# Patient Record
Sex: Male | Born: 1975 | Race: White | Hispanic: No | Marital: Single
Health system: Midwestern US, Community
[De-identification: ages and names within clinical notes are randomized; demographics above are authoritative.]

## PROBLEM LIST (undated history)

## (undated) DIAGNOSIS — N200 Calculus of kidney: Secondary | ICD-10-CM

## (undated) DIAGNOSIS — K219 Gastro-esophageal reflux disease without esophagitis: Secondary | ICD-10-CM

## (undated) DIAGNOSIS — K649 Unspecified hemorrhoids: Secondary | ICD-10-CM

## (undated) DIAGNOSIS — K449 Diaphragmatic hernia without obstruction or gangrene: Secondary | ICD-10-CM

## (undated) DIAGNOSIS — M545 Low back pain, unspecified: Secondary | ICD-10-CM

## (undated) HISTORY — PX: TESTICLE TORSION REDUCTION: SHX795

---

## 2011-11-12 ENCOUNTER — Inpatient Hospital Stay
Admit: 2011-11-12 | Discharge: 2011-11-13 | Disposition: A | Discharge status: Home / Self Care | Attending: Emergency Medicine

## 2011-11-12 MED ADMIN — ibuprofen (ADVIL;MOTRIN) tablet 600 mg: ORAL | @ 23:00:00 | NDC 53746046505

## 2011-11-12 MED FILL — IBUPROFEN 600 MG PO TABS: 600 MG | ORAL | Qty: 1
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
## 2012-02-14 ENCOUNTER — Inpatient Hospital Stay
Admit: 2012-02-14 | Discharge: 2012-02-15 | Disposition: A | Discharge status: Home / Self Care | Attending: Emergency Medicine

## 2012-02-14 LAB — CBC WITH AUTO DIFFERENTIAL
Basophils %: 0.6 %
Basophils Absolute: 0.1 10*3/uL (ref 0.0–0.2)
Eosinophils %: 1.8 %
Eosinophils Absolute: 0.2 10*3/uL (ref 0.0–0.6)
Hematocrit: 49.3 % (ref 40.5–52.5)
Hemoglobin: 16 g/dL (ref 13.5–17.5)
Lymphocytes %: 34.9 %
Lymphocytes Absolute: 3.5 10*3/uL (ref 1.0–5.1)
MCH: 30 pg (ref 26.0–34.0)
MCHC: 32.4 g/dL (ref 31.0–36.0)
MCV: 92.6 fL (ref 80.0–100.0)
MPV: 6.8 fL (ref 5.0–10.5)
Monocytes %: 8.3 %
Monocytes Absolute: 0.8 10*3/uL (ref 0.0–1.3)
Neutrophils %: 54.4 %
Neutrophils Absolute: 5.4 10*3/uL (ref 1.7–7.7)
Platelets: 287 10*3/uL (ref 135–450)
RBC: 5.32 M/uL (ref 4.20–5.90)
RDW: 12.8 % (ref 12.4–15.4)
WBC: 9.9 10*3/uL (ref 4.0–11.0)

## 2012-02-14 NOTE — Progress Notes (Signed)
This encounter was created in error - please disregard.

## 2012-02-14 NOTE — ED Provider Notes (Signed)
[place] COMPLAINT  Chest Pain and Back Pain      HISTORY OF PRESENT ILLNESS  Jesse Ortiz is a 36 y.o. male with PMH of asthma, allergies, GERD who presents  to the ED by private vehicle with complaints of chest tightness and low back pain.    Back pain-he reports that he is intermittently had lower back pain off and on for many years, and the last 4 days however his lower back pain has been worse, it is sharp in nature and feels like a needle stabbing him in the back.  It is in the middle lower back.  It sometimes radiates slightly higher in his back but does not radiate into his legs.  He occasionally has tingling in his legs but not currently his sensation is normal.  He has no saddle anesthesia, bowel or bladder dysfunction.  He reports that certain movements will exacerbate the pain, sitting up in a chair too tall or when he is lying on his side in bed and pulled his knees to his chest.  He also notes that if he looks down with his head and put his chin to his chest, this also can exacerbate his discomfort.  Ambulating normally.  He is active with his job    Chest pain-this is also been occurring more over the last 4 days although he reports that he has more chronically had intermittent sharp pains in the anterior section of his chest.  In the last 4 days he's noticed more of a pressure.  It is worse with deep breathing or palpation.  It will last for a few minutes and then resolve.  He reports an associated dizziness, particularly with standing up.  He does not feel his asthma has been acting up, he has not had wheezing, fevers or URI symptoms.  No diaphoresis.  No dyspnea.      Jesse Ortiz's positive PE risk factors include: none.  Jesse Ortiz does not have the following risk factors: calf pain, history of deep venous thrombosis, history of pulmonary embolus, history of malignancy, long duration of automobile or plane travel, prolonged stay in bed, recent  limb injury or fracture, recent surgery and varicose veins.    Jesse Ortiz's positive cardiac risk factors include:  smoker, asthma.  Jesse Ortiz does not have the following risk factors: known cardiac disease, prior MI, Diabetes Mellitus, hypertension, hypercholesterolemia/hyperlipidemia, CHF, arrhythmia, generalized debilitation, COPD, renal disease.    No other complaints, modifying factors or associated symptoms.     Nursing notes reviewed.   Past Medical History   Diagnosis Date   ??? GERD (gastroesophageal reflux disease)    ??? Seasonal allergies      Past Surgical History   Procedure Laterality Date   ??? Testicle surgery       History reviewed. No pertinent family history.  History     Social History   ??? Marital Status: Single     Spouse Name: N/A     Number of Children: N/A   ??? Years of Education: N/A     Occupational History   ??? Not on file.     Social History Main Topics   ??? Smoking status: Current Every Day Smoker -- 1.00 packs/day   ??? Smokeless tobacco: Not on file   ??? Alcohol Use: Yes      monthly   ??? Drug Use: Yes     Special: Marijuana  last used 02/13/12 am   ??? Sexually Active: Not on file     Other Topics Concern   ??? Not on file     Social History Narrative   ??? No narrative on file     Prior to Admission medications    Medication Sig Start Date End Date Taking? Authorizing Provider   famotidine (PEPCID) 10 MG tablet Take 10 mg by mouth 2 times daily.    Historical Provider, MD     Allergies   Allergen Reactions   ??? Bee        REVIEW OF SYSTEMS    Constitutional:  Denies fever, chills, sweats, + generalized weakness  HENT:  Denies nasal congestion or sore throat   Eyes:  Denies change in vision  Respiratory:  Denies cough or shortness of breath   Cardiovascular:  +chest pain, denies edema   GI:  Denies abdominal pain, nausea, vomiting, diarrhea   GU:  Denies dysuria, difficulty urinating, bowel or bladder dysfunction  Neurologic:  Denies headache, focal weakness or sensory changes,  paresthesias/paralysis  Musculoskeletal:  + low back pain   Psychiatric:  Denies depression or anxiety       PHYSICAL EXAM  BP 133/78   Pulse 62   Temp(Src) 97.9 ??F (36.6 ??C) (Oral)   Resp 16   Ht 5' 9"$  (1.753 m)   Wt 180 lb (81.647 kg)   BMI 26.57 kg/m2   SpO2 98%  GENERAL APPEARANCE: Awake and alert. Cooperative. Well appearing young male, in no acute distress  HEAD: Normocephalic. Atraumatic.  EYES: EOM's grossly intact. Conjunctiva are WNLs  ENT: Mucous membranes are moist.   NECK: Normal ROM.   CV: Equal symmetric chest rise.  RRR, no m/r/g.  2+ distal pulses in all 4 extremities  CHEST: anterior chest is ttp which reproduces his complaint  LUNGS: Breathing is unlabored. Speaking comfortably in full sentences. CTA B.  No wheezing, rhonchi, rales  ABDOMEN: Normoactive bowel sounds.  Soft, nontender, nondistended  GU: deferred   EXTREMITIES: . Moving all 4 extremities spontaneously.  No acute deformities. All extremities neurovascularly intact.  BACK: he has no thoracic midline or paraspinal tenderness to palpation.  He has mild midline lumbosacral tenderness to palpation that reproduces his complaint, no paraspinal tenderness bilaterally lumbosacral.  His normal range of motion of his back in flexion and extension.  SKIN: Warm and dry.    NEUROLOGICAL: Alert and oriented.  Normal coordination. 5/5 strength in bilateral lower chin recent hip flexion, ankle plantar flexion, sensation to light touch is intact, patellar reflexes are 2+ and ankle jerk reflexes are 1+ bilaterally.    PSYCH: Mood and affect normal.      LABS  Results for orders placed during the hospital encounter of 02/14/12   CBC WITH AUTO DIFFERENTIAL       Result Value Range    WBC 9.9  4.0 - 11.0 K/uL    RBC 5.32  4.20 - 5.90 M/uL    Hemoglobin 16.0  13.5 - 17.5 g/dL    Hematocrit 49.3  40.5 - 52.5 %    MCV 92.6  80.0 - 100.0 fL    MCH 30.0  26.0 - 34.0 pg    MCHC 32.4  31.0 - 36.0 g/dL    RDW 12.8  12.4 - 15.4 %    Platelets 287  135 - 450 K/uL     MPV 6.8  5.0 - 10.5 fL    Neutrophils Relative 54.4  Lymphocytes Relative 34.9      Monocytes Relative 8.3      Eosinophils 1.8      Basophils Relative 0.6      Neutrophils Absolute 5.4  1.7 - 7.7 K/uL    Lymphocytes Absolute 3.5  1.0 - 5.1 K/uL    Monocytes Absolute 0.8  0.0 - 1.3 K/uL    Eosinophils Absolute 0.2  0.0 - 0.6 K/uL    Basophils Absolute 0.1  0.0 - 0.2 K/uL   TROPONIN       Result Value Range    Troponin I <0.006  0.000 - 0.040 ng/mL   PROTIME-INR       Result Value Range    Protime 10.8  10.0 - 12.8 sec    INR 0.96  0.85 - 1.15   COMPREHENSIVE METABOLIC PANEL       Result Value Range    Sodium 144  136 - 145 mEq/L    Potassium 4.1  3.5 - 5.1 mEq/L    Chloride 105  99 - 110 mEq/L    CO2 28  21 - 32 mEq/L    Glucose 107 (*) 70 - 99 mg/dL    BUN 24 (*) 7 - 18 mg/dL    Creatinine, Ser 1.4 (*) 0.9 - 1.3 mg/dL    GFR Non-African American >60  >60    GFR African American >60  >60    Calcium 10.4  8.3 - 10.6 mg/dL    Total Protein 8.5 (*) 6.4 - 8.2 g/dL    Alb 5.2 (*) 3.4 - 5.0 g/dL    Albumin/Globulin Ratio 1.6  1.1 - 2.2    Total Bilirubin 0.46  0.00 - 1.00 mg/dL    Alkaline Phosphatase 65  45 - 129 U/L    ALT 16  10 - 40 U/L    AST 17  15 - 37 U/L    Globulin 3     TROPONIN       Result Value Range    Troponin I <0.006  0.000 - 0.040 ng/mL        RADIOLOGY  X-RAYS: I have reviewed the films and provided a preliminary interpretation unless formal radiology read is provided    All non-plain film images such as CT, MRI, Ultrasound, have been read and interpreted by the Radiologist.    XR CHEST STANDARD TWO VW    Final Result: IMPRESSION:          No acute cardiopulmonary abnormality    chest x-ray by my preliminary review reveals no infiltrate, pneumothorax and mediastinum appears normal        EKG  The Ekg interpreted by me in the absence of a cardiologist shows.   normal sinus rhythm at a rate of 97.  Intervals and axis are normal.  There nonspecific changes without evidence of acute ischemia.  There  is a sinus arrhythmia.  There are no old EKGs for comparison.      Repeat EKG performed at 9:39 PM  Normal sinus rhythm at a rate of 62 with sinus arrhythmia.  Intervals and axis are normal.  There nonspecific changes without evidence of acute ischemia.  There is no significant change from EKG earlier today         ED COURSE / MDM  Patient seen and evaluated. Labs and imaging reviewed and results discussed with patient    ALYN LOKEN is a 36 y.o. male with 2 complaints, chest pain and back pain.  With respect  to his lower back pain, this appears to be musculoskeletal in nature.  36 y.o. male with acute back pain.  his neurological exam is normal and he is afebrile. The hx and PE are c/w mechanical low back pain.  There is no evidence of  a vascular or infectious etiology, cauda equina, epidural abscess, nor an acute fracture.  Given hospital reassuring exam, no further imaging/MRI is indicated at this time.      He is also complaining of intermittent chest tightness over the last several days with intermittent chest discomfort off-and-on on a more chronic basis.  Initial EKG and troponin are negative.  He is an otherwise relatively healthy aside from his smoking history.  Plan will be to treat symptomatically and checked a second troponin and EKG.  He meets PERC 8 Criteria for DVT, is low risk and does not require additional investigation    On final reevaluation at 10:45 PM, repeat EKG and troponin remained negative.  The patient did receive some relief with medications here although his had intermittent and changing pain throughout his stay.  We have discussed taking his Pepcid on a more regular basis to help with his reflux.  We also reviewed the imaging studies and laboratory data obtained here.  I've advised that his back pain is likely musculoskeletal in nature and demonstrates no high-risk symptoms, plan will be for symptomatic care, work note and counseled on the appropriate form when lifting as he is  required to lift heavy weights at work.    The plan at this time will be to establish primary care, and referred him to our on-call primary care physician as well as our patient advocate for further evaluation and management of his symptoms.    Given Jesse Ortiz overall clinical presentation and the w/u detailed above, I feel that dc home with outpt f/u is appropriate at this time. I have discussed all of my concerns as well as s/s for which to return with the patient. I have reviewed test results and answered any questions to the best of my ability. I have discussed s/s for which to return to the ED, and Jesse Ortiz expresses understanding of results and plan, agrees with the disposition, and assures me will that he will return to the ED with ANY progression in symptoms.      ED Treatments Administered-      Medications   famotidine (PEPCID) injection 20 mg (20 mg Intravenous Given 02/14/12 1940)   ketorolac (TORADOL) injection 30 mg (30 mg Intravenous Given 02/14/12 1940)   0.9 % sodium chloride bolus (1,000 mLs Intravenous Given 02/14/12 2011)   GI COCKTAIL 1 Bottle (1 Bottle Oral Given 02/14/12 2035)   morphine (PF) injection 4 mg (4 mg Intravenous Given 02/14/12 2035)        Results for orders placed during the hospital encounter of 02/14/12   CBC WITH AUTO DIFFERENTIAL       Result Value Range    WBC 9.9  4.0 - 11.0 K/uL    RBC 5.32  4.20 - 5.90 M/uL    Hemoglobin 16.0  13.5 - 17.5 g/dL    Hematocrit 49.3  40.5 - 52.5 %    MCV 92.6  80.0 - 100.0 fL    MCH 30.0  26.0 - 34.0 pg    MCHC 32.4  31.0 - 36.0 g/dL    RDW 12.8  12.4 - 15.4 %    Platelets 287  135 - 450 K/uL  MPV 6.8  5.0 - 10.5 fL    Neutrophils Relative 54.4      Lymphocytes Relative 34.9      Monocytes Relative 8.3      Eosinophils 1.8      Basophils Relative 0.6      Neutrophils Absolute 5.4  1.7 - 7.7 K/uL    Lymphocytes Absolute 3.5  1.0 - 5.1 K/uL    Monocytes Absolute 0.8  0.0 - 1.3 K/uL    Eosinophils Absolute 0.2  0.0 - 0.6 K/uL     Basophils Absolute 0.1  0.0 - 0.2 K/uL   TROPONIN       Result Value Range    Troponin I <0.006  0.000 - 0.040 ng/mL   PROTIME-INR       Result Value Range    Protime 10.8  10.0 - 12.8 sec    INR 0.96  0.85 - 1.15   COMPREHENSIVE METABOLIC PANEL       Result Value Range    Sodium 144  136 - 145 mEq/L    Potassium 4.1  3.5 - 5.1 mEq/L    Chloride 105  99 - 110 mEq/L    CO2 28  21 - 32 mEq/L    Glucose 107 (*) 70 - 99 mg/dL    BUN 24 (*) 7 - 18 mg/dL    Creatinine, Ser 1.4 (*) 0.9 - 1.3 mg/dL    GFR Non-African American >60  >60    GFR African American >60  >60    Calcium 10.4  8.3 - 10.6 mg/dL    Total Protein 8.5 (*) 6.4 - 8.2 g/dL    Alb 5.2 (*) 3.4 - 5.0 g/dL    Albumin/Globulin Ratio 1.6  1.1 - 2.2    Total Bilirubin 0.46  0.00 - 1.00 mg/dL    Alkaline Phosphatase 65  45 - 129 U/L    ALT 16  10 - 40 U/L    AST 17  15 - 37 U/L    Globulin 3     TROPONIN       Result Value Range    Troponin I <0.006  0.000 - 0.040 ng/mL       I estimate there is LOW risk for PULMONARY EMBOLISM, ACUTE CORONARY SYNDROME, OR THORACIC AORTIC DISSECTION, ABDOMINAL AORTIC ANEURYSM, CAUDA EQUINA SYNDROME, EPIDURAL MASS LESION, SPINAL STENOSIS, OR HERNIATED DISK CAUSING SEVERE STENOSIS, thus I consider the discharge disposition reasonable. Jesse Ortiz and I have discussed the diagnosis and risks, and we agree with discharging home to follow-up with their primary doctor. We also discussed returning to the Emergency Department immediately if new or worsening symptoms occur. We have discussed the symptoms which are most concerning (e.g., bloody sputum, fever, worsening pain or shortness of breath, vomiting) that necessitate immediate return.     Clinical Impression    1. Chest pain    2. Low back pain        Blood pressure 133/78, pulse 62, temperature 97.9 ??F (36.6 ??C), temperature source Oral, resp. rate 16, height 5' 9"$  (1.753 m), weight 180 lb (81.647 kg), SpO2 98.00%.     DISPOSITION - Patient was discharged in stable  condition    Jesse Ortiz was given scripts for the following medications. I counseled patient how to take these medications.   New Prescriptions    CYCLOBENZAPRINE (FLEXERIL) 10 MG TABLET    Take 1 tablet by mouth 3 times daily as needed for Muscle spasms (pain) for 15 doses.  No driving while taking this medication    HYDROCODONE-ACETAMINOPHEN (NORCO) 5-325 MG PER TABLET    Take 1 tablet by mouth every 6 hours as needed for Pain for 15 doses. This medication contains acetaminophen (tylenol) - do not take more than 30109m of acetaminophen per day from all sources. No driving or other high risk behavior while taking this medication       Disclaimer: This chart was created using the [employer].       KRonna Polio MD  02/14/12 [phone]

## 2012-02-14 NOTE — Discharge Instructions (Signed)
If you have trouble scheduling an appointment with the physician that we have referred you to today, please contact our Patient Resource Advocate, Purcell Nails. If she is unavailable, please leave a message and she will return your call.     Patient Resource Advocate  Purcell Nails  [phone]        Return for increased or worsening chest pain, trouble breathing, fever, productive cough, dizziness, if you pass out or feel like you might pass out or if you are worse in any way.     Return immediately for worsening pain, numbness or tingling in your arms or legs, if you  are unable to walk or develop weakness in one arm or leg, if you cannot hold your urine  or stool, or if you develop nausea and vomiting that will not resolve.                      Back Exercises  Back exercises help treat and prevent back injuries. The goal is to increase your strength in your belly (abdominal) and back muscles. These exercises can also help with flexibility. Start these exercises when told by your doctor.  HOME CARE  Back exercises include:  Pelvic Tilt.   Lie on your back with your knees bent. Tilt your pelvis until the lower part of your back is against the floor. Hold this position 5 to 10 sec. Repeat this exercise 5 to 10 times.  Knee to Chest.   Pull 1 knee up against your chest and hold for 20 to 30 seconds. Repeat this with the other knee. This may be done with the other leg straight or bent, whichever feels better. Then, pull both knees up against your chest.  Sit-Ups or Curl-Ups.   Bend your knees 90 degrees. Start with tilting your pelvis, and do a partial, slow sit-up. Only lift your upper half 30 to 45 degrees off the floor. Take at least 2 to 3 seonds for each sit-up. Do not do sit-ups with your knees out straight. If partial sit-ups are difficult, simply do the above but with only tightening your belly (abdominal) muscles and holding it as told.  Hip-Lift.   Lie on your back with your knees flexed 90 degrees.  Push down with your feet and shoulders as you raise your hips 2 inches off the floor. Hold for 10 seconds, repeat 5 to 10 times.  Back Arches.   Lie on your stomach. Prop yourself up on bent elbows. Slowly press on your hands, causing an arch in your low back. Repeat 3 to 5 times.  Shoulder-Lifts.   Lie face down with arms beside your body. Keep hips and belly pressed to floor as you slowly lift your head and shoulders off the floor.  Do not overdo your exercises. Be careful in the beginning. Exercises may cause you some mild back discomfort. If the pain lasts for more than 15 minutes, stop the exercises until you see your doctor. Improvement with exercise for back problems is slow.    Document Released: 05/01/2010 Document Revised: 06/21/2011 Document Reviewed: 01/28/2011  [facility] Patient Information 2013 [place].        Back Pain, Adult  Back pain is very common. The pain often gets better over time. The cause of back pain is usually not dangerous. Most people can learn to manage their back pain on their own.    HOME CARE     Stay active. Start with short walks  on flat ground if you can. Try to walk farther each day.   Do not sit, drive, or stand in one place for more than 30 minutes. Do not stay in bed.   Do not avoid exercise or work. Activity can help your back heal faster.   Be careful when you bend or lift an object. Bend at your knees, keep the object close to you, and do not twist.   Sleep on a firm mattress. Lie on your side, and bend your knees. If you lie on your back, put a pillow under your knees.   Only take medicines as told by your doctor.   Put ice on the injured area.   Put ice in a plastic bag.   Place a towel between your skin and the bag.   Leave the ice on for 15 to 20 minutes, 3 to 4 times a day for the first 2 to 3 days. After that, you can switch between ice and heat packs.   Ask your doctor about back exercises or massage.   Avoid feeling anxious or stressed. Find  good ways to deal with stress, such as exercise.  GET HELP RIGHT AWAY IF:     Your pain does not go away with rest or medicine.   Your pain does not go away in 1 week.   You have new problems.   You do not feel well.   The pain spreads into your legs.   You cannot control when you poop (bowel movement) or pee (urinate).   Your arms or legs feel weak or lose feeling (numbness).   You feel sick to your stomach (nauseous) or throw up (vomit).   You have belly (abdominal) pain.   You feel like you may pass out (faint).  MAKE SURE YOU:     Understand these instructions.   Will watch your condition.   Will get help right away if you are not doing well or get worse.  Document Released: 09/15/2007 Document Revised: 06/21/2011 Document Reviewed: 08/17/2010  [facility] Patient Information 2013 [place].        Chest Pain, Nonspecific  It is often hard to give a specific diagnosis for the cause of chest pain. There is always a chance that your pain could be related to something serious, like a heart attack or a blood clot in the lungs. You need to follow up with your caregiver for further evaluation. More lab tests or other studies such as X-rays, electrocardiography, stress testing, or cardiac imaging may be needed to find the cause of your pain.  Most of the time, nonspecific chest pain improves within 2 to 3 days with rest and mild pain medicine. For the next few days, avoid physical exertion or activities that bring on pain. Do not smoke. Avoid drinking alcohol. Call your caregiver for routine follow-up as advised.    SEEK IMMEDIATE MEDICAL CARE IF:   You develop increased chest pain or pain that radiates to the arm, neck, jaw, back, or abdomen.   You develop shortness of breath, increased coughing, or you start coughing up blood.   You have severe back or abdominal pain, nausea, or vomiting.   You develop severe weakness, fainting, fever, or chills.  Document Released: 03/29/2005 Document Revised:  06/21/2011 Document Reviewed: 09/16/2006  [facility] Patient Information 2013 [place].

## 2012-02-14 NOTE — ED Notes (Signed)
Discharge instructions given pt verbalize understanding. Pt ambulated to door to ride without difficulty     Christa See, RN  02/14/12 2311

## 2012-02-15 LAB — COMPREHENSIVE METABOLIC PANEL
ALT: 16 U/L (ref 10–40)
AST: 17 U/L (ref 15–37)
Albumin/Globulin Ratio: 1.6 (ref 1.1–2.2)
Albumin: 5.2 g/dL — ABNORMAL HIGH (ref 3.4–5.0)
Alkaline Phosphatase: 65 U/L (ref 45–129)
BUN: 24 mg/dL — ABNORMAL HIGH (ref 7–18)
CO2: 28 mEq/L (ref 21–32)
Calcium: 10.4 mg/dL (ref 8.3–10.6)
Chloride: 105 mEq/L (ref 99–110)
Creatinine: 1.4 mg/dL — ABNORMAL HIGH (ref 0.9–1.3)
GFR African American: >60 (ref >60)
GFR Non-African American: >60 (ref >60)
Globulin: 3 g/dL
Glucose: 107 mg/dL — ABNORMAL HIGH (ref 70–99)
Potassium: 4.1 mEq/L (ref 3.5–5.1)
Sodium: 144 mEq/L (ref 136–145)
Total Bilirubin: 0.46 mg/dL (ref 0.00–1.00)
Total Protein: 8.5 g/dL — ABNORMAL HIGH (ref 6.4–8.2)

## 2012-02-15 LAB — PROTIME-INR
INR: 0.96 (ref 0.85–1.15)
Protime: 10.8 s (ref 10.0–12.8)

## 2012-02-15 LAB — TROPONIN
Troponin I: <0.006 ng/mL (ref 0.000–0.040)
Troponin I: <0.006 ng/mL (ref 0.000–0.040)

## 2012-02-15 MED ORDER — CYCLOBENZAPRINE HCL 10 MG PO TABS
10 MG | ORAL_TABLET | Freq: Three times a day (TID) | ORAL | Status: AC | PRN
Start: 2012-02-15 — End: 2012-02-24

## 2012-02-15 MED ORDER — HYDROCODONE-ACETAMINOPHEN 5-325 MG PO TABS
5-325 MG | ORAL_TABLET | Freq: Four times a day (QID) | ORAL | Status: AC | PRN
Start: 2012-02-15 — End: 2012-02-21

## 2012-02-15 MED ADMIN — morphine (PF) injection 4 mg: INTRAVENOUS | @ 02:00:00 | NDC 00409189101

## 2012-02-15 MED ADMIN — ketorolac (TORADOL) injection 30 mg: INTRAVENOUS | @ 01:00:00 | NDC 00409379501

## 2012-02-15 MED ADMIN — famotidine (PEPCID) injection 20 mg: INTRAVENOUS | @ 01:00:00 | NDC 00069012101

## 2012-02-15 MED ADMIN — GI COCKTAIL 1 Bottle: ORAL | @ 02:00:00 | NDC 09999990073

## 2012-02-15 MED ADMIN — 0.9 % sodium chloride bolus: INTRAVENOUS | @ 01:00:00 | NDC 00338004904

## 2012-02-15 MED FILL — FAMOTIDINE 10 MG/ML IV SOLN: 10 MG/ML | INTRAVENOUS | Qty: 2

## 2012-02-15 MED FILL — MORPHINE SULFATE (PF) 4 MG/ML IV SOLN: 4 MG/ML | INTRAVENOUS | Qty: 1

## 2012-02-15 MED FILL — KETOROLAC TROMETHAMINE 30 MG/ML IJ SOLN: 30 MG/ML | INTRAMUSCULAR | Qty: 1

## 2012-02-15 MED FILL — GI COCKTAIL: Qty: 1

## 2012-02-15 MED FILL — SODIUM CHLORIDE 0.9 % IV SOLN: 0.9 % | INTRAVENOUS | Qty: 1000

## 2012-02-16 LAB — EKG 12-LEAD
Atrial Rate: 62 {beats}/min
Atrial Rate: 97 {beats}/min
P Axis: 37 degrees
P Axis: 66 degrees
P-R Interval: 120 ms
P-R Interval: 126 ms
Q-T Interval: 336 ms
Q-T Interval: 396 ms
QRS Duration: 92 ms
QRS Duration: 98 ms
QTc Calculation (Bazett): 401 ms
QTc Calculation (Bazett): 426 ms
R Axis: 51 degrees
R Axis: 76 degrees
T Axis: 38 degrees
T Axis: 53 degrees
Ventricular Rate: 62 {beats}/min
Ventricular Rate: 97 {beats}/min

## 2012-11-16 ENCOUNTER — Inpatient Hospital Stay: Admit: 2012-11-16 | Discharge: 2012-11-16 | Attending: Emergency Medicine

## 2012-11-16 MED ORDER — CYCLOBENZAPRINE HCL 10 MG PO TABS
10 MG | ORAL_TABLET | Freq: Three times a day (TID) | ORAL | Status: AC | PRN
Start: 2012-11-16 — End: 2012-11-26

## 2012-11-16 MED ORDER — NAPROXEN 500 MG PO TABS
500 MG | ORAL_TABLET | Freq: Two times a day (BID) | ORAL | Status: DC
Start: 2012-11-16 — End: 2013-11-23

## 2012-11-16 MED ADMIN — orphenadrine (NORFLEX) injection 60 mg: INTRAMUSCULAR | @ 11:00:00 | NDC 17478053802

## 2012-11-16 MED ADMIN — ketorolac (TORADOL) injection 60 mg: INTRAMUSCULAR | @ 11:00:00 | NDC 00409379601

## 2012-11-16 MED FILL — KETOROLAC TROMETHAMINE 60 MG/2ML IJ SOLN: 60 MG/2ML | INTRAMUSCULAR | Qty: 2

## 2012-11-16 MED FILL — ORPHENADRINE CITRATE 30 MG/ML IJ SOLN: 30 MG/ML | INTRAMUSCULAR | Qty: 2

## 2012-11-16 NOTE — Discharge Instructions (Signed)

## 2012-11-16 NOTE — ED Notes (Signed)
Medicated per MAR. Friend at bedside.     Evalina Field, RN  11/16/12 [phone]

## 2012-11-16 NOTE — ED Notes (Signed)
Reports to ER today for complaints of lower back pain after picking up a large rock. States he heard a "pop".  VSS. Call light in reach. Will monitor.     Vilinda Blanks, RN  11/16/12 [phone]

## 2012-11-16 NOTE — ED Provider Notes (Signed)
CHIEF COMPLAINT  Back Pain      HISTORY OF PRESENT ILLNESS  Jesse Ortiz  is a 37 y.o. male who presents to the ED at via private vehicle complaining of back pain.  Patient reports that he works in [employer].  Yesterday early in the morning he began noting back pain while lifting a  heavy rock.  Pain is located along the left lower back.  Patient reports mild pain while at rest, however, with certain movements such as twisting, pain is rated as 10/10.  Patient denies any blunt trauma to the back.  He further denies any radicular type symptoms, urinary incontinence, or saddle anesthesia.  Patient reports that he took "a couple of Advil "yesterday with moderate relief.  No other alleviating or exacerbating factors.    There are no other complaints, modifying factors or associated symptoms.     Nursing notes reviewed.       Past medical history:  has a past medical history of GERD (gastroesophageal reflux disease) and Seasonal allergies.    Past surgical history:  has past surgical history that includes Testicle surgery.    Home medications:   Prior to Admission medications    Medication Sig Start Date End Date Taking? Authorizing Provider   cyclobenzaprine (FLEXERIL) 10 MG tablet Take 1 tablet by mouth 3 times daily as needed for Muscle spasms for up to 10 days. 11/16/12 11/26/12 Yes Kaylyn Lim, DO   naproxen (NAPROSYN) 500 MG tablet Take 1 tablet by mouth 2 times daily. 11/16/12  Yes Kaylyn Lim, DO   famotidine (PEPCID) 10 MG tablet Take 10 mg by mouth 2 times daily.    Historical Provider, MD       Allergies   Allergen Reactions   ??? Nutritional Supplements        Social history:  reports that he has been smoking.  He does not have any smokeless tobacco history on file. He reports that  drinks alcohol. He reports that he uses illicit drugs (Marijuana).    Family history:  No family history on file.    REVIEW OF SYSTEMS  6 systems reviewed, pertinent positives per HPI otherwise noted to be  negative    PHYSICAL EXAM  Filed Vitals:    11/16/12 0619   BP: 151/76   Pulse: 86   Temp: 98.3 ??F (36.8 ??C)   Resp: 16       GENERAL: Patient is well-developed, well-nourished,  no acute distress.  Moderate  discomfort.  Non toxic appearing.   HEENT:  Normocephalic, atraumatic. PERRL.  Conjunctiva appear normal.  External ears are normal.  MMM  NECK: Supple with normal ROM.  Trachea midline  LUNGS:  Normal work of breathing.  Speaking comfortably in full sentences.  EXTREMITIES: 2+ distal pulses w/o edema.    MUSCULOSKELETAL:  no midline pain of cervical, thoracic, or lumbar spine.  Left paraspinal lumbar tenderness to palpation.  No tenderness of the sacroiliac joint.   Atraumatic extremities with normal ROM grossly.  No obvious bony deformities.  SKIN: Warm/dry.  No rashes/lesions noted.   PSYCHIATRIC: Patient is alert and oriented with normal affect  NEUROLOGIC: Cranial nerves grossly intact. Moves all extremities with equal strength. No gross sensory deficits.  No urinary incontinence or saddle anesthesia.  Negative straight leg test bilaterally.  Patellar reflexes are brisk.    Answers questions/follows commands appropriately.      ED COURSE/MDM  Nursing notes reviewed.  Pt was given the following medications  or treatments in the ED:       Patient was provided Toradol and Norflex injections in the emergency department.        Clinical Impression  Based on the presenting complaint, history, and physical exam, multiple diagnoses were considered.  Exam and workup here most c/w:  1. Lumbar strain, initial encounter        I discussed with Jesse Ortiz the results of evaluation in the ED, diagnosis, care, and prognosis.  The plan is to discharge to home.  Patient is in agreement with plan and questions have been answered.     I also discussed with Jesse Ortiz the reasons which may require a return visit and the importance of follow-up care.  Patient was provided follow-up information Dr. Joelene Millin from orthopedics   The patient is well-appearing, nontoxic, and improved at the time of discharge.  Patient agrees to call to arrange follow-up care as directed.   Jesse Ortiz understands to return immediately for worsening/change in symptoms.      Patient will be started on the following medications from the ED:  New Prescriptions    CYCLOBENZAPRINE (FLEXERIL) 10 MG TABLET    Take 1 tablet by mouth 3 times daily as needed for Muscle spasms for up to 10 days.    NAPROXEN (NAPROSYN) 500 MG TABLET    Take 1 tablet by mouth 2 times daily.         Disposition  Pt is discharged in stable condition.    Disposition Vitals:  BP 151/76   Pulse 86   Temp(Src) 98.3 ??F (36.8 ??C)   Resp 16   Ht 5' 9"$  (1.753 m)   Wt 175 lb (79.379 kg)   BMI 25.83 kg/m2   SpO2 97%                    Kaylyn Lim, DO  11/16/12 [phone]

## 2013-10-19 MED ORDER — METHYLPREDNISOLONE (PAK) 4 MG PO TABS
4 MG | ORAL_TABLET | ORAL | Status: DC
Start: 2013-10-19 — End: 2013-11-23

## 2013-10-19 NOTE — Progress Notes (Signed)
CHIEF COMPLAINT:  low back pain    HISTORY OF PRESENT ILLNESS: Jesse Ortiz is a 38 year old male who presents today for evaluation of chronic low back pain.  He reports that he originally had some discomfort approximately 1 year ago.  He went to the emergency department for management.  He was given a muscle relaxant which did not give him any significant relief.  The pain he presents for evaluation with today is the same as his previous pain.  He is located in the left back pocket with radiation down the left leg.  He has positive numbness and tingling of the left lower extremity.  He denies any injury or fall.  His lower back is been bothering him since Monday of this week.  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  He denies any previous surgical intervention of the lumbar spine.  He denies any loss of bladder or bowel function. He admits to regular marijuana use which does help alleviate his pain.     REVIEW OF SYSTEMS:  Relevant review of systems reviewed and available in the patient's chart:     PHYSICAL EXAMINATION: Inspection of the lumbar spine shows no edema, deformity, abrasion.  No tenderness to palpation throughout the spinal column.  Some tenderness to palpation over left-sided SI.  There is tenderness to palpation on the left side paraspinal region.  There is a positive left lower extremity straight leg raise.  Positive cross leg test.  Positive bowstring sign.  Positive popliteal squeeze.  Reflexes at knees and ankles are full and equivocal.  Lower extremity strength is full and equivocal.  There is a minimally positive straight leg raise to the right lower extremity as well with a negative bowstring sign.    Inspection of the bilateral hips shows full and appropriate passive range of motion without any discomfort.  Negative logroll.    Cervical spine: The skin is warm and dry. There is no swelling, warmth, or erythema. Range of motion is within normal limits. There is no  paraspinal or spinous process tenderness. Spurling's sign is negative and did not produce shoulder pain. The distal neurovascular exam is grossly intact.    X-RAYS:  5 views of the lumbar spine were ordered and reviewed in the office today.  These demonstrate some degenerative changes with narrowing at L5-S1.  No evidence of acute fracture or dislocation.    ASSESSMENT:   1.  Chronic low back pain  2.  Lumbar degenerative disc disease  3.  HNP, left L5-S1    PLAN: I had a detailed discussion in the office with the patient.  I have recommended treatment to include a Medrol Dosepak.  I've also recommended an MRI in order to further evaluate any lumbar disc protrusion particularly at L5-S1 on the left side.  Following the MRI he will have a follow-up evaluation with our nonoperative spine physician for further treatment options.  He voiced understanding and agreed.

## 2013-10-23 ENCOUNTER — Encounter

## 2013-11-23 MED ORDER — MELOXICAM 15 MG PO TABS
15 MG | ORAL_TABLET | ORAL | Status: AC
Start: 2013-11-23 — End: ?

## 2013-11-23 NOTE — Progress Notes (Signed)
New Patient: SPINE    CHIEF COMPLAINT:    Chief Complaint   Patient presents with   ??? Back Pain   ??? Leg Pain       HISTORY OF PRESENT ILLNESS:                The patient is a 38 y.o. male whom reports a 3 year history of chronic but infrequent low back pain which is been more severe and constant over the last 1 month.  He describes aching 90% low back pain 10% tingling affecting the left posterior leg.  Symptoms initially were fairly constant and severe.  Pain is increased with prolonged activity bending or twisting.  Relief with resting and heat, changing position.  Conservative care includes NSAIDs, MDP.  Overall 60% improved at this time.  Denies any lower extremity weakness or recent bowel or bladder issues    Current/Past Treatment:   ?? Physical Therapy: HEP  ?? Chiropractic:  No   ?? Injection:   No  ?? Medications:   MDP, NSAIDs  ?? Surgery/Consult: no    Work Status/Functionality: AdministratorLandscaper    Past Medical History:   Past Medical History   Diagnosis Date   ??? GERD (gastroesophageal reflux disease)    ??? Seasonal allergies       Past Surgical History:     Past Surgical History   Procedure Laterality Date   ??? Testicle surgery       Current Medications:   Current outpatient prescriptions:meloxicam (MOBIC) 15 MG tablet, i po qd PRN, Disp: 30 tablet, Rfl: 1;  esomeprazole Magnesium (NEXIUM) 20 MG PACK, Take 20 mg by mouth daily, Disp: , Rfl:   Allergies:  Bee venom and Nutritional supplements  Social History:    reports that he has been smoking.  He has never used smokeless tobacco. He reports that he drinks alcohol. He reports that he uses illicit drugs (Marijuana).  Family History:   History reviewed. No pertinent family history.    REVIEW OF SYSTEMS: Full ROS noted & scanned   CONSTITUTIONAL: Denies unexplained weight loss, fevers, chills or fatigue  NEUROLOGICAL: Denies unsteady gait or progressive weakness  MUSCULOSKELETAL: Denies joint swelling or redness  PSYCHOLOGICAL: Denies anxiety, admits depression   SKIN:  Denies skin changes, delayed healing, rash, itching   HEMATOLOGIC: Denies easy bleeding or bruising  ENDOCRINE: Denies excessive thirst, urination, heat/cold  RESPIRATORY: Denies current dyspnea, cough  GI: Denies nausea, vomiting, diarrhea   GU: Denies bowel or bladder issues       PHYSICAL EXAM:    Vitals: Blood pressure 127/82, pulse 76, height 5\' 9"  (1.753 m), weight 175 lb (79.379 kg).    GENERAL EXAM:  ?? General Apparence: Patient is adequately groomed with no evidence of malnutrition.  ?? Orientation: The patient is oriented to time, place and person.   ?? Mood & Affect:The patient's mood and affect are appropriate   ?? Vascular: Examination reveals no swelling tenderness in upper or lower extremities. Good capillary refill  ?? Lymphatic: The lymphatic examination bilaterally reveals all areas to be without enlargement or induration  ?? Sensation: Sensation is intact without deficit  ?? Coordination/Balance: Good coordination   LUMBAR/SACRAL EXAMINATION:  ?? Inspection: Local inspection shows no step-off or bruising.  Lumbar alignment is normal.  Sagittal and Coronal balance is neutral.      ?? Palpation:   No evidence of tenderness at the midline.  No tenderness bilaterally at the paraspinal or trochanters.  There is no step-off  or paraspinal spasm.   ?? Range of Motion:  Moderate loss flexion and extension  ?? Strength:   Strength testing is 5/5 in all muscle groups tested.   ?? Special Tests:   Straight leg raise and crossed SLR negative.  Leg length and pelvis level.  0 out of 5 Waddell's signs.        ?? Skin: There are no rashes, ulcerations or lesions.  ?? Reflexes: Reflexes are symmetrically 2+ at the patellar and ankle tendons; 1+ Achilles.  Clonus absent bilaterally at the feet.  ?? Gait & station: normal, patient ambulates without assistance  ?? Additional Examinations:   ?? RIGHT LOWER EXTREMITY: Inspection/examination of the right lower extremity does not show any tenderness, deformity or injury. Range of motion  is unremarkable. There is no gross instability.  There are no rashes, ulcerations or lesions. Strength and tone are normal.  ?? LEFT LOWER EXTREMITY:  Inspection/examination of the left lower extremity does not show any tenderness, deformity or injury. Range of motion is unremarkable. There is no gross instability. There are no rashes, ulcerations or lesions.  Strength and tone are normal.    Diagnostic Testing:    Lumbar MRI scan report reviewed 10/23/2013 showing L5-S1 DDD with central disc protrusion      Impression:  1) Acute/chronic episodic lbp, mild radiculitis   2) L5-S1 DDD, central protrusion      Plan:   1) Pt exercises provided   2) Mobic 15mg  i po qd PRN   3) He was shown a quick draw today but will obtain OTC  4) Call if wishing to proceed w/left L5-S1 LESI #1 then f/u for re-evaluation           Wonda Cerise [facility]

## 2016-09-01 ENCOUNTER — Emergency Department
Admission: EM | Admit: 2016-09-01 | Discharge: 2016-09-01 | Disposition: A | Discharge status: Home / Self Care | Payer: Self-pay | Attending: Emergency Medicine | Admitting: Emergency Medicine

## 2016-09-01 ENCOUNTER — Emergency Department: Payer: Self-pay

## 2016-09-01 ENCOUNTER — Encounter: Payer: Self-pay | Admitting: Intensive Care

## 2016-09-01 DIAGNOSIS — M6281 Muscle weakness (generalized): Secondary | ICD-10-CM | POA: Insufficient documentation

## 2016-09-01 DIAGNOSIS — R42 Dizziness and giddiness: Secondary | ICD-10-CM

## 2016-09-01 DIAGNOSIS — R200 Anesthesia of skin: Secondary | ICD-10-CM | POA: Insufficient documentation

## 2016-09-01 DIAGNOSIS — I10 Essential (primary) hypertension: Secondary | ICD-10-CM | POA: Insufficient documentation

## 2016-09-01 DIAGNOSIS — Z5181 Encounter for therapeutic drug level monitoring: Secondary | ICD-10-CM | POA: Insufficient documentation

## 2016-09-01 DIAGNOSIS — F17210 Nicotine dependence, cigarettes, uncomplicated: Secondary | ICD-10-CM | POA: Insufficient documentation

## 2016-09-01 DIAGNOSIS — R29898 Other symptoms and signs involving the musculoskeletal system: Secondary | ICD-10-CM

## 2016-09-01 DIAGNOSIS — H93231 Hyperacusis, right ear: Secondary | ICD-10-CM

## 2016-09-01 DIAGNOSIS — H9191 Unspecified hearing loss, right ear: Secondary | ICD-10-CM | POA: Insufficient documentation

## 2016-09-01 LAB — URINE DRUG SCREEN, QUALITATIVE (ARMC ONLY)
Amphetamines, Ur Screen: NOT DETECTED
BARBITURATES, UR SCREEN: NOT DETECTED
Benzodiazepine, Ur Scrn: NOT DETECTED
COCAINE METABOLITE, UR ~~LOC~~: NOT DETECTED
Cannabinoid 50 Ng, Ur ~~LOC~~: POSITIVE — AB
MDMA (ECSTASY) UR SCREEN: POSITIVE — AB
METHADONE SCREEN, URINE: NOT DETECTED
OPIATE, UR SCREEN: NOT DETECTED
Phencyclidine (PCP) Ur S: NOT DETECTED
Tricyclic, Ur Screen: NOT DETECTED

## 2016-09-01 LAB — CBC
HCT: 39.7 % — ABNORMAL LOW (ref 40.0–52.0)
HEMOGLOBIN: 13.4 g/dL (ref 13.0–18.0)
MCH: 29.9 pg (ref 26.0–34.0)
MCHC: 33.7 g/dL (ref 32.0–36.0)
MCV: 88.9 fL (ref 80.0–100.0)
PLATELETS: 294 10*3/uL (ref 150–440)
RBC: 4.46 MIL/uL (ref 4.40–5.90)
RDW: 12.9 % (ref 11.5–14.5)
WBC: 7.8 10*3/uL (ref 3.8–10.6)

## 2016-09-01 LAB — BASIC METABOLIC PANEL
ANION GAP: 6 (ref 5–15)
BUN: 22 mg/dL — ABNORMAL HIGH (ref 6–20)
CALCIUM: 9 mg/dL (ref 8.9–10.3)
CO2: 24 mmol/L (ref 22–32)
CREATININE: 1.13 mg/dL (ref 0.61–1.24)
Chloride: 108 mmol/L (ref 101–111)
Glucose, Bld: 98 mg/dL (ref 65–99)
Potassium: 3.8 mmol/L (ref 3.5–5.1)
SODIUM: 138 mmol/L (ref 135–145)

## 2016-09-01 LAB — PROTIME-INR
INR: 0.94
PROTHROMBIN TIME: 12.6 s (ref 11.4–15.2)

## 2016-09-01 LAB — URINALYSIS, COMPLETE (UACMP) WITH MICROSCOPIC
Bacteria, UA: NONE SEEN
Bilirubin Urine: NEGATIVE
Glucose, UA: NEGATIVE mg/dL
HGB URINE DIPSTICK: NEGATIVE
KETONES UR: NEGATIVE mg/dL
Leukocytes, UA: NEGATIVE
NITRITE: NEGATIVE
PH: 6 (ref 5.0–8.0)
PROTEIN: NEGATIVE mg/dL
Specific Gravity, Urine: 1.02 (ref 1.005–1.030)
Squamous Epithelial / LPF: NONE SEEN

## 2016-09-01 LAB — APTT: aPTT: 26 seconds (ref 24–36)

## 2016-09-01 LAB — ETHANOL: Alcohol, Ethyl (B): <5 mg/dL (ref <5)

## 2016-09-01 MED ORDER — IOPAMIDOL (ISOVUE-300) INJECTION 61%
100.0000 mL | Freq: Once | INTRAVENOUS | Status: AC | PRN
  Administered 2016-09-01: 100 mL via INTRAVENOUS
  Filled 2016-09-01: qty 100

## 2016-09-01 MED ORDER — ASPIRIN EC 81 MG PO TBEC: 81.0000 mg | DELAYED_RELEASE_TABLET | Freq: Every day | ORAL | 0 refills | Status: AC

## 2016-09-01 MED ORDER — LISINOPRIL 5 MG PO TABS: 5.0000 mg | ORAL_TABLET | Freq: Every day | ORAL | 0 refills | Status: AC

## 2016-09-01 NOTE — Discharge Instructions (Signed)
Please make an appointment with Dr. Malvin JohnsPotter, the neurologist, for further evaluation of your symptoms.  Please drink plenty of fluids, especially when you are working outdoors.  Work to minimize your stress.  Return to the emergency department for severe pain, fainting, numbness tingling or weakness or any other symptoms concerning to you.

## 2016-09-01 NOTE — ED Provider Notes (Addendum)
[facility] Emergency Department Provider Note  ____________________________________________  Time seen: Approximately 6:19 PM  I have reviewed the triage vital signs and the nursing notes.   HISTORY  Chief Complaint Dizziness    HPI Chad Bell is a 41 y.o. male with ongoing tobacco abuse presenting w/ 48mo of intermittent right upper extremity numbness and weakness, right neck and TMJ pain, tinnitus, and lightheadedness with standing.  The patient describes that he came in today after an episode at workaround 10:30 where he had postural lightheadedness. He does work outdoors and it has been hot but states he has been drinking plenty of water. He tried to pick up a chicken nugget, and felt like his right arm was weak and that he had tingling down the entirety of the right arm. He also had a mild discomfort at the right TMJ and in the right neck, and when he looked to the right, he had a sound "like sand rubbing together."  No injury, chiropractic manipulation and denies cocaine.  The patient states he has had several intermittent more mild episodes very similar to this over the past month that have self resolved. Today's episode was worse than usual which is why he came in for evaluation.   History reviewed. No pertinent past medical history.  There are no active problems to display for this patient.   Past Surgical History:  Procedure Laterality Date  . TESTICLE TORSION REDUCTION        Allergies Patient has no known allergies.  History reviewed. No pertinent family history.  Social History Social History  Substance Use Topics  . Smoking status: Current Every Day Smoker    Packs/day: 1.00    Types: Cigarettes  . Smokeless tobacco: Never Used  . Alcohol use 3.6 oz/week    6 Shots of liquor per week    Review of Systems Constitutional: No fever/chills.Positive postural lightheadedness. No syncope. No trauma. Eyes: No visual changes. No blurred  or double vision. EARS: Abnormal sound in the right year when he is symptomatic. ENT: No sore throat. No congestion or rhinorrhea. Right neck and TMJ discomfort. Cardiovascular: Denies chest pain. Denies palpitations. Respiratory: Denies shortness of breath.  No cough. Gastrointestinal: No abdominal pain.  No nausea, no vomiting.  No diarrhea.  No constipation. Genitourinary: Negative for dysuria. Musculoskeletal: Negative for back pain. Skin: Negative for rash. Neurological: Negative for headaches. Positive for right arm weakness and right upper extremity numbness.  Psychiatric:Report significant stress.  10-point ROS otherwise negative.  ____________________________________________   PHYSICAL EXAM:  VITAL SIGNS: ED Triage Vitals  Enc Vitals Group     BP 09/01/16 1642 (!) 150/96     Pulse Rate 09/01/16 1642 81     Resp 09/01/16 1642 16     Temp 09/01/16 1642 98.7 F (37.1 C)     Temp Source 09/01/16 1642 Oral     SpO2 09/01/16 1642 98 %     Weight 09/01/16 1643 185 lb (83.9 kg)     Height 09/01/16 1643 5\' 9"  (1.753 m)     Head Circumference --      Peak Flow --      Pain Score 09/01/16 1723 0     Pain Loc --      Pain Edu? --      Excl. in GC? --     Constitutional: Alert and oriented. Well appearing and in no acute distress. Answers questions appropriately. Eyes: Conjunctivae are normal.  EOMI. No scleral icterus. Head:  Atraumatic. Nose: No congestion/rhinnorhea. Mouth/Throat: Mucous membranes are moist.  Neck: No stridor.  Supple.   Cardiovascular: Normal rate, regular rhythm. No murmurs, rubs or gallops.  Respiratory: Normal respiratory effort.  No accessory muscle use or retractions. Lungs CTAB.  No wheezes, rales or ronchi. Gastrointestinal: Soft, nontender and nondistended.  No guarding or rebound.  No peritoneal signs. Musculoskeletal: No LE edema. No ttp in the calves or palpable cords.  Negative Homan's sign. Neurologic: Alert and oriented 3. Speech is  clear. Face and smile symmetric. Tongue is midline. EOMI. PERRLA. No horizontal or vertical nystagmus. Normal visual fields bilaterally. No pronator drift. 5 out of 5 grip, biceps, triceps, hip flexors, plantar flexion and dorsiflexion. Normal sensation to light touch in the bilateral upper and lower extremities, and face; I am unable to elicit the patient's sensory deficit on my examination.. Normal finger-nose-finger. The patient does have lightheadedness immediately with standing up so gait is not evaluated. Skin:  Skin is warm, dry and intact. No rash noted. Psychiatric: Mood and affect are normal. Speech and behavior are normal.  Normal judgement  ____________________________________________   LABS (all labs ordered are listed, but only abnormal results are displayed)  Labs Reviewed  BASIC METABOLIC PANEL - Abnormal; Notable for the following:       Result Value   BUN 22 (*)    All other components within normal limits  CBC - Abnormal; Notable for the following:    HCT 39.7 (*)    All other components within normal limits  URINALYSIS, COMPLETE (UACMP) WITH MICROSCOPIC - Abnormal; Notable for the following:    Color, Urine YELLOW (*)    APPearance CLEAR (*)    All other components within normal limits  ETHANOL  APTT  PROTIME-INR  URINE DRUG SCREEN, QUALITATIVE ([facility] ONLY)  CBG MONITORING, ED   ____________________________________________  EKG  ED ECG REPORT I, Rockne MenghiniNorman, Anne-Caroline, the attending physician, personally viewed and interpreted this ECG.   Date: 09/01/2016  EKG Time: 1639  Rate: 78  Rhythm: normal sinus rhythm  Axis: leftward  Intervals:none  ST&T Change: Nonspecific T-wave inversion in V1. No ST elevation.  ____________________________________________  RADIOLOGY  Ct Angio Head W Or Wo Contrast  Result Date: 09/01/2016 CLINICAL DATA:  Dizziness and paresthesias.  Visual disturbances. EXAM: CT ANGIOGRAPHY HEAD AND NECK TECHNIQUE: Multidetector CT  imaging of the head and neck was performed using the standard protocol during bolus administration of intravenous contrast. Multiplanar CT image reconstructions and MIPs were obtained to evaluate the vascular anatomy. Carotid stenosis measurements (when applicable) are obtained utilizing NASCET criteria, using the distal internal carotid diameter as the denominator. CONTRAST:  100mL ISOVUE-300 IOPAMIDOL (ISOVUE-300) INJECTION 61% COMPARISON:  Head CT 09/01/2016 FINDINGS: CTA NECK FINDINGS Aortic arch: There is no aneurysm or dissection of the visualized ascending aorta or aortic arch. There is a normal 3 vessel branching pattern. The visualized proximal subclavian arteries are normal. Right carotid system: The right common carotid origin is widely patent. There is no common carotid or internal carotid artery dissection or aneurysm. No hemodynamically significant stenosis. Left carotid system: The left common carotid origin is widely patent. There is no common carotid or internal carotid artery dissection or aneurysm. No hemodynamically significant stenosis. Vertebral arteries: The vertebral system is left dominant. Both vertebral artery origins are normal. Both vertebral arteries are normal to their confluence with the basilar artery. Skeleton: There is no bony spinal canal stenosis. No lytic or blastic lesions. Other neck: The nasopharynx is clear. The oropharynx and  hypopharynx are normal. The epiglottis is normal. The supraglottic larynx, glottis and subglottic larynx are normal. No retropharyngeal collection. The parapharyngeal spaces are preserved. The parotid and submandibular glands are normal. No sialolithiasis or salivary ductal dilatation. The thyroid gland is normal. There is no cervical lymphadenopathy. Upper chest: No pneumothorax or pleural effusion. No nodules or masses. Review of the MIP images confirms the above findings CTA HEAD FINDINGS Anterior circulation: --Intracranial internal carotid  arteries: Normal. --Anterior cerebral arteries: Normal. --Middle cerebral arteries: Normal. --Posterior communicating arteries: Present on the right. Posterior circulation: --Posterior cerebral arteries: Normal. --Superior cerebellar arteries: Normal. --Basilar artery: Normal. --Anterior inferior cerebellar arteries: Normal. --Posterior inferior cerebellar arteries: Normal. Venous sinuses: As permitted by contrast timing, patent. Anatomic variants: None Delayed phase: No parenchymal contrast enhancement. Review of the MIP images confirms the above findings IMPRESSION: Normal CTA of the head and neck. Electronically Signed   By: Deatra Robinson M.D.   On: 09/01/2016 19:03   Ct Head Wo Contrast  Result Date: 09/01/2016 CLINICAL DATA:  41 year old male with intermittent dizziness EXAM: CT HEAD WITHOUT CONTRAST TECHNIQUE: Contiguous axial images were obtained from the base of the skull through the vertex without intravenous contrast. COMPARISON:  None. FINDINGS: Brain: No evidence of acute infarction, hemorrhage, hydrocephalus, extra-axial collection or mass lesion/mass effect. Vascular: No hyperdense vessel or unexpected calcification. Skull: Normal. Negative for fracture or focal lesion. Sinuses/Orbits: No acute finding. Other: None. IMPRESSION: Negative head CT. Electronically Signed   By: Malachy Moan M.D.   On: 09/01/2016 17:22   Ct Angio Neck W And/or Wo Contrast  Result Date: 09/01/2016 CLINICAL DATA:  Dizziness and paresthesias.  Visual disturbances. EXAM: CT ANGIOGRAPHY HEAD AND NECK TECHNIQUE: Multidetector CT imaging of the head and neck was performed using the standard protocol during bolus administration of intravenous contrast. Multiplanar CT image reconstructions and MIPs were obtained to evaluate the vascular anatomy. Carotid stenosis measurements (when applicable) are obtained utilizing NASCET criteria, using the distal internal carotid diameter as the denominator. CONTRAST:  ISOVUE-300  IOPAMIDOL (ISOVUE-300) INJECTION 61% COMPARISON:  Head CT 09/01/2016 FINDINGS: CTA NECK FINDINGS Aortic arch: There is no aneurysm or dissection of the visualized ascending aorta or aortic arch. There is a normal 3 vessel branching pattern. The visualized proximal subclavian arteries are normal. Right carotid system: The right common carotid origin is widely patent. There is no common carotid or internal carotid artery dissection or aneurysm. No hemodynamically significant stenosis. Left carotid system: The left common carotid origin is widely patent. There is no common carotid or internal carotid artery dissection or aneurysm. No hemodynamically significant stenosis. Vertebral arteries: The vertebral system is left dominant. Both vertebral artery origins are normal. Both vertebral arteries are normal to their confluence with the basilar artery. Skeleton: There is no bony spinal canal stenosis. No lytic or blastic lesions. Other neck: The nasopharynx is clear. The oropharynx and hypopharynx are normal. The epiglottis is normal. The supraglottic larynx, glottis and subglottic larynx are normal. No retropharyngeal collection. The parapharyngeal spaces are preserved. The parotid and submandibular glands are normal. No sialolithiasis or salivary ductal dilatation. The thyroid gland is normal. There is no cervical lymphadenopathy. Upper chest: No pneumothorax or pleural effusion. No nodules or masses. Review of the MIP images confirms the above findings CTA HEAD FINDINGS Anterior circulation: --Intracranial internal carotid arteries: Normal. --Anterior cerebral arteries: Normal. --Middle cerebral arteries: Normal. --Posterior communicating arteries: Present on the right. Posterior circulation: --Posterior cerebral arteries: Normal. --Superior cerebellar arteries: Normal. --Basilar artery: Normal. --Anterior  inferior cerebellar arteries: Normal. --Posterior inferior cerebellar arteries: Normal. Venous sinuses: As  permitted by contrast timing, patent. Anatomic variants: None Delayed phase: No parenchymal contrast enhancement. Review of the MIP images confirms the above findings IMPRESSION: Normal CTA of the head and neck. Electronically Signed   By: Deatra Robinson M.D.   On: 09/01/2016 19:03    ____________________________________________   PROCEDURES  Procedure(s) performed: None  Procedures  Critical Care performed: No ____________________________________________   INITIAL IMPRESSION / ASSESSMENT AND PLAN / ED COURSE  Pertinent labs & imaging results that were available during my care of the patient were reviewed by me and considered in my medical decision making (see chart for details).  41 y.o. male with a history of ongoing tobacco abuse presenting with intermittent episodes of right face, neck, and upper extremity numbness and weakness over the past month with associated postural lightheadedness. Overall, the patient is mildly hypertensive but has no focal neurologic deficits on examination except for lightheadedness with standing. I am unable to elicit his motor or sensory deficits but he states that he still has some. His CT does not show any acute intracranial process, but I am concerned about carotid dissection or posterior stroke, so CT angiogram of the head and neck has been ordered. I have also ordered an associated for other possible disease dates including multiple sclerosis which should be less likely, intermittent seizures, again unlikely. Dehydration causing the patient's lightheadedness is also possible. Plan reevaluation for final disposition.  ----------------------------------------- 7:43 PM on 09/01/2016 ----------------------------------------- The patient's workup in the emergency department has been reassuring. His laboratory studies do not show any acute abnormalities. A CT head, CT angiogram head and neck, also did not show any acute abnormalities. He has been evaluated by  the neurologist on-call through [facility]. At this time, there are no acute findings for the patient's symptoms. The [facility] neurologist recommends discharge home with close neurology follow-up. I will plan to discharge the patient home on a low-dose aspirin with lisinopril for his hypertension. I have discussed follow-up instructions as well as return precautions with the patient and his girlfriend who understand.  ____________________________________________  FINAL CLINICAL IMPRESSION(S) / ED DIAGNOSES  Final diagnoses:  Lightheadedness  Hearing abnormally acute, right  Right arm weakness  Right arm numbness         NEW MEDICATIONS STARTED DURING THIS VISIT:  New Prescriptions   No medications on file      Rockne Menghini, MD 09/01/16 1944    Rockne Menghini, MD 09/01/16 1949

## 2016-09-01 NOTE — ED Triage Notes (Addendum)
Patient states "I have been having dizziness on and off and when I close my eyes I get tingling down my spine and bilateral arms. I couldn't pick up my food earlier due to my hands not making the movements to pick it up. I can't focus" No distress noted in triage. Ambulatory in triage with no problems. Also reports hazy vision. A&O X4. Denies any recent injury or trauma to head. No LOC

## 2016-09-01 NOTE — ED Notes (Signed)
Lab called and were unable to add on UDS.  UDS added to Urine.

## 2016-09-01 NOTE — ED Notes (Addendum)
soc neuro on at this time

## 2018-07-19 IMAGING — CT CT ANGIO HEAD
2 of 8 series · 9 of 33 positions shown · IV contrast (APPLIED)
Comparison: Head CT 09/01/2016

CLINICAL DATA: Dizziness and paresthesias.  Visual disturbances.

EXAM:
CT ANGIOGRAPHY HEAD AND NECK
TECHNIQUE: Multidetector CT imaging of the head and neck was performed using
the standard protocol during bolus administration of intravenous
contrast. Multiplanar CT image reconstructions and MIPs were
obtained to evaluate the vascular anatomy. Carotid stenosis
measurements (when applicable) are obtained utilizing NASCET
criteria, using the distal internal carotid diameter as the
denominator.
CONTRAST:  100mL [id number] IOPAMIDOL ([id number]) INJECTION 61%

[Series 4: cta head neck · axial · 0.56mm/px · z∈[-377,+3]mm · 3 of 191 slices shown]
[im 1/191  soft-tissue]
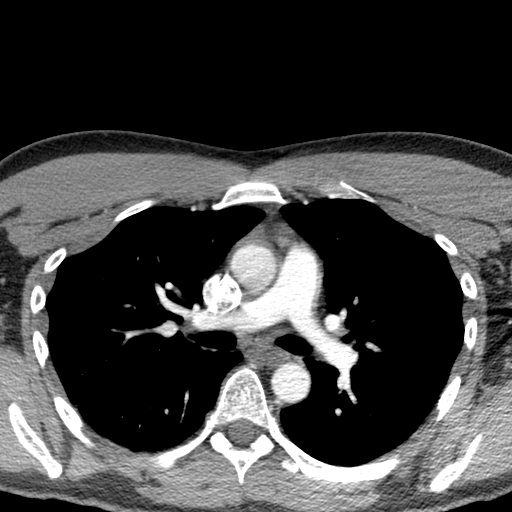
[im 96/191  bone]
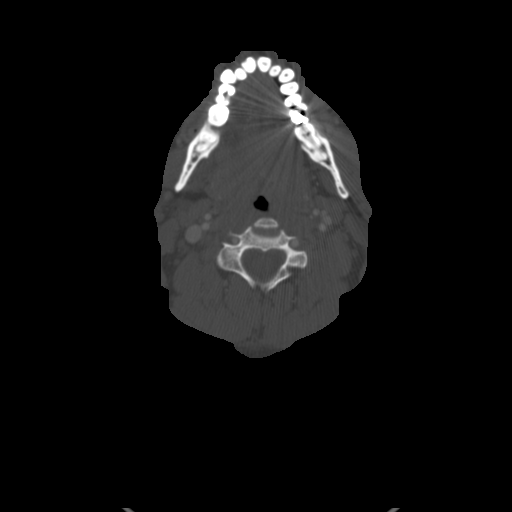
[im 191/191  soft-tissue]
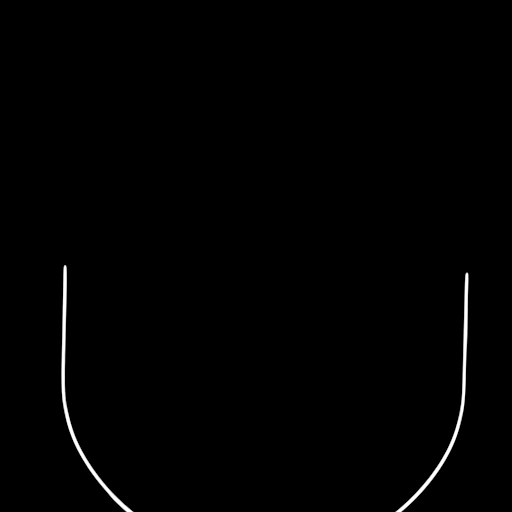

[Series 6: ax thin · axial · 0.42mm/px · z∈[-362,-108]mm · 6 of 385 slices shown]
[im 55/385  soft-tissue]
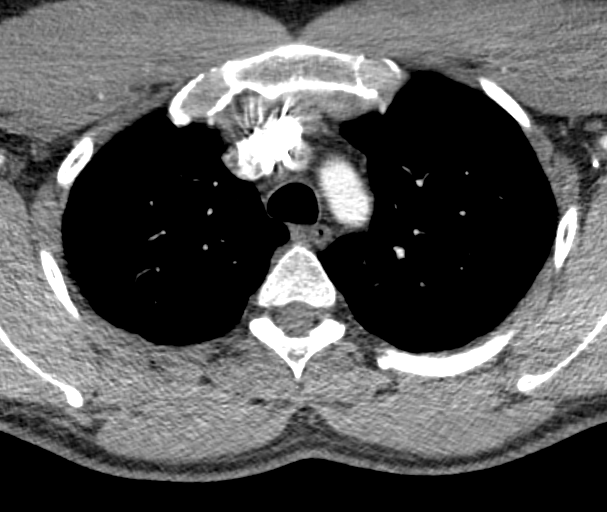
[im 110/385  soft-tissue]
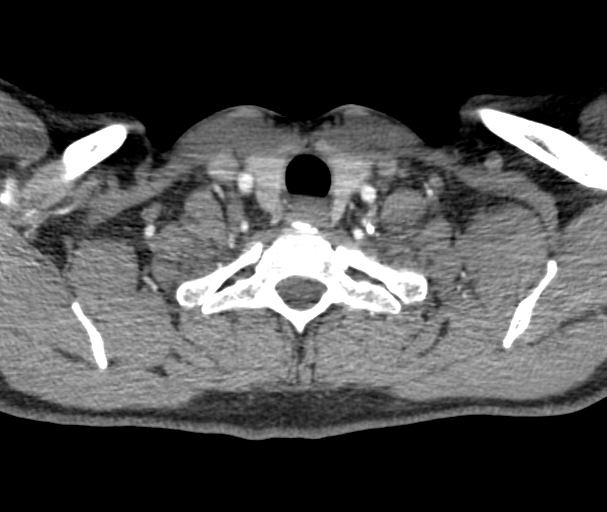
[im 165/385  soft-tissue]
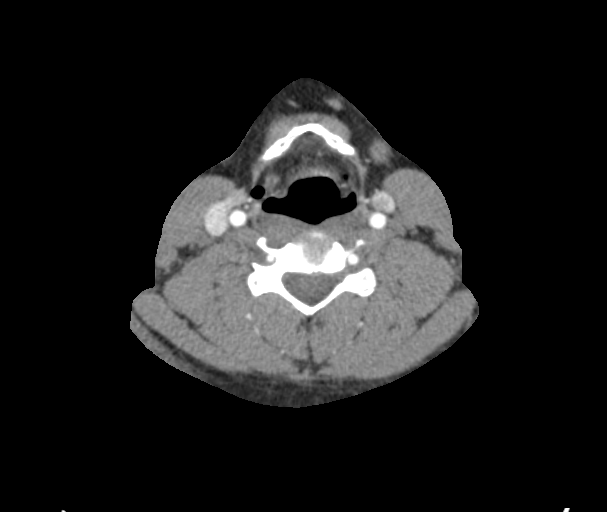
[im 220/385  soft-tissue]
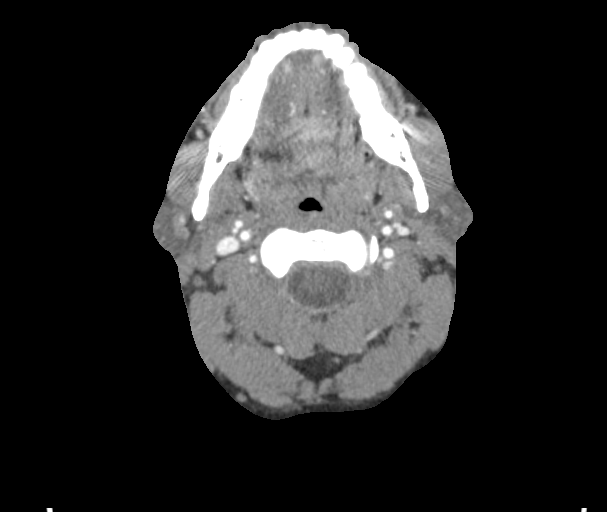
[im 275/385  soft-tissue]
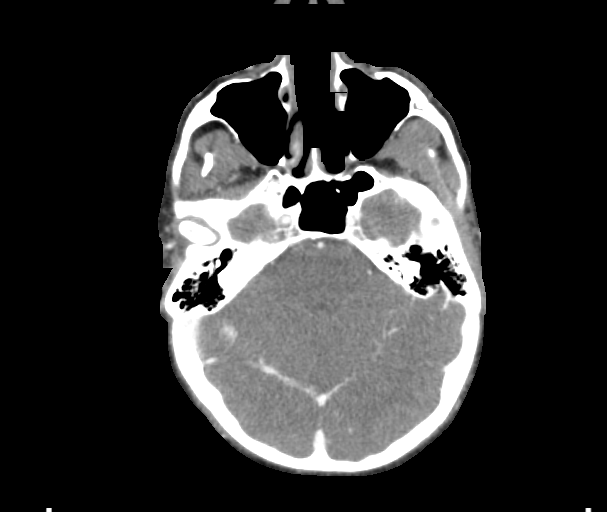
[im 330/385  soft-tissue]
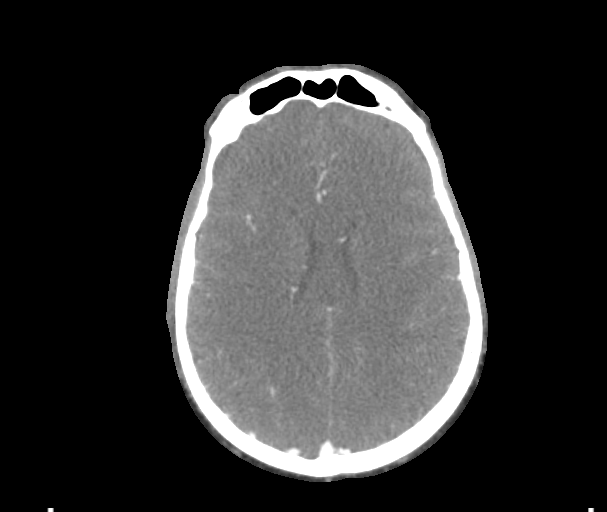

[9 of 33 positions shown; findings below may reference images not displayed]

FINDINGS: CTA NECK FINDINGS

Aortic arch: There is no aneurysm or dissection of the visualized
ascending aorta or aortic arch. There is a normal 3 vessel branching
pattern. The visualized proximal subclavian arteries are normal.

Right carotid system: The right common carotid origin is widely
patent. There is no common carotid or internal carotid artery
dissection or aneurysm. No hemodynamically significant stenosis.

Left carotid system: The left common carotid origin is widely
patent. There is no common carotid or internal carotid artery
dissection or aneurysm. No hemodynamically significant stenosis.

Vertebral arteries: The vertebral system is left dominant. Both
vertebral artery origins are normal. Both vertebral arteries are
normal to their confluence with the basilar artery.

Skeleton: There is no bony spinal canal stenosis. No lytic or
blastic lesions.

Other neck: The nasopharynx is clear. The oropharynx and hypopharynx
are normal. The epiglottis is normal. The supraglottic larynx,
glottis and subglottic larynx are normal. No retropharyngeal
collection. The parapharyngeal spaces are preserved. The parotid and
submandibular glands are normal. No sialolithiasis or salivary
ductal dilatation. The thyroid gland is normal. There is no cervical
lymphadenopathy.

Upper chest: No pneumothorax or pleural effusion. No nodules or
masses.

Review of the MIP images confirms the above findings

CTA HEAD FINDINGS

Anterior circulation:

--Intracranial internal carotid arteries: Normal.

--Anterior cerebral arteries: Normal.

--Middle cerebral arteries: Normal.

--Posterior communicating arteries: Present on the right.

Posterior circulation:

--Posterior cerebral arteries: Normal.

--Superior cerebellar arteries: Normal.

--Basilar artery: Normal.

--Anterior inferior cerebellar arteries: Normal.

--Posterior inferior cerebellar arteries: Normal.

Venous sinuses: As permitted by contrast timing, patent.

Anatomic variants: None

Delayed phase: No parenchymal contrast enhancement.

Review of the MIP images confirms the above findings
IMPRESSION: Normal CTA of the head and neck.

## 2021-09-03 ENCOUNTER — Emergency Department (INDEPENDENT_AMBULATORY_CARE_PROVIDER_SITE_OTHER)
Admission: EM | Admit: 2021-09-03 | Discharge: 2021-09-03 | Disposition: A | Discharge status: Home / Self Care | Payer: 59 | Source: Home / Self Care

## 2021-09-03 DIAGNOSIS — L918 Other hypertrophic disorders of the skin: Secondary | ICD-10-CM | POA: Diagnosis not present

## 2021-09-03 HISTORY — DX: Gastro-esophageal reflux disease without esophagitis: K21.9

## 2021-09-03 HISTORY — DX: Diaphragmatic hernia without obstruction or gangrene: K44.9

## 2021-09-03 MED ORDER — DOXYCYCLINE HYCLATE 100 MG PO CAPS: 100.0000 mg | ORAL_CAPSULE | Freq: Two times a day (BID) | ORAL | 0 refills | Status: AC

## 2021-09-03 NOTE — ED Provider Notes (Signed)
[name]    CSN: [id number] Arrival date & time: 09/03/21  1712      History   Chief Complaint Chief Complaint  Patient presents with   Skin Tag    HPI Chad Bell is a 46 y.o. male.   HPI 46 year old male presents with skin tag to left axillary area which he tied dental floss around 3 days ago in attempt to remove skin tag from left axillary area.  Reports area is sore to touch and areas blistered now from tip of skin tag.  PMH significant for GERD and hiatal hernia. Patient is a current everyday cigarette smoker.  Past Medical History:  Diagnosis Date   GERD (gastroesophageal reflux disease)    Hiatal hernia     There are no problems to display for this patient.   Past Surgical History:  Procedure Laterality Date   TESTICLE TORSION REDUCTION         Home Medications    Prior to Admission medications   Medication Sig Start Date End Date Taking? Authorizing Provider  doxycycline (VIBRAMYCIN) 100 MG capsule Take 1 capsule (100 mg total) by mouth 2 (two) times daily for 7 days. 09/03/21 09/10/21 Yes Trevor Iha, FNP  esomeprazole (NEXIUM) 40 MG capsule Take by mouth. 06/22/19  Yes [provider]  fexofenadine (ALLEGRA) 180 MG tablet Take 180 mg by mouth daily. 08/06/21   [provider]  lisinopril (PRINIVIL,ZESTRIL) 5 MG tablet Take 1 tablet (5 mg total) by mouth daily. 09/01/16 09/01/17  Rockne Menghini, MD    Family History Family History  Problem Relation Age of Onset   Fibromyalgia Mother    Cancer Father     Social History Social History   Tobacco Use   Smoking status: Every Day    Packs/day: 1.00    Types: Cigarettes   Smokeless tobacco: Never  Vaping Use   Vaping Use: Never used  Substance Use Topics   Alcohol use: Yes    Alcohol/week: 6.0 standard drinks    Types: 6 Shots of liquor per week   Drug use: Yes    Types: Marijuana     Allergies   Patient has no known allergies.   Review of  Systems Review of Systems  Skin:        Skin tag of left axillary area  All other systems reviewed and are negative.   Physical Exam Triage Vital Signs ED Triage Vitals  Enc Vitals Group     BP 09/03/21 1732 (!) 144/95     Pulse Rate 09/03/21 1732 94     Resp 09/03/21 1732 20     Temp 09/03/21 1732 98.1 F (36.7 C)     Temp Source 09/03/21 1732 Oral     SpO2 09/03/21 1732 95 %     Weight 09/03/21 1728 220 lb (99.8 kg)     Height 09/03/21 1728 5\' 9"  (1.753 m)     Head Circumference --      Peak Flow --      Pain Score 09/03/21 1728 0     Pain Loc --      Pain Edu? --      Excl. in GC? --    No data found.  Updated Vital Signs BP (!) 144/95 (BP Location: Right Arm)   Pulse 94   Temp 98.1 F (36.7 C) (Oral)   Resp 20   Ht 5\' 9"  (1.753 m)   Wt 220 lb (99.8 kg)   SpO2 95%  BMI 32.49 kg/m     Physical Exam Vitals and nursing note reviewed.  Constitutional:      Appearance: Normal appearance. He is normal weight.  HENT:     Head: Normocephalic and atraumatic.     Mouth/Throat:     Mouth: Mucous membranes are moist.     Pharynx: Oropharynx is clear.  Eyes:     Extraocular Movements: Extraocular movements intact.     Conjunctiva/sclera: Conjunctivae normal.     Pupils: Pupils are equal, round, and reactive to light.  Cardiovascular:     Rate and Rhythm: Normal rate and regular rhythm.     Pulses: Normal pulses.     Heart sounds: Normal heart sounds.  Pulmonary:     Effort: Pulmonary effort is normal.     Breath sounds: Normal breath sounds. No wheezing, rhonchi or rales.  Musculoskeletal:     Cervical back: Normal range of motion and neck supple.  Skin:    General: Skin is warm and dry.     Comments: Left upper chest area adjacent to superior medial axilla: small 4 mm x 4 mm circular shaped skin tag noted with dental floss tied at base of skin tag in attempt to remove patient 3 days ago; 0 iris scissors used to snip skin tag at base, silver nitrate skin  applicator was used to achieve homeostasis, adhesive Band-Aid used to cover area.  Patient tolerated procedure well without complication.  Neurological:     General: No focal deficit present.     Mental Status: He is alert and oriented to person, place, and time. Mental status is at baseline.     UC Treatments / Results  Labs (all labs ordered are listed, but only abnormal results are displayed) Labs Reviewed - No data to display  EKG   Radiology No results found.  Procedures Procedures (including critical care time)  Medications Ordered in UC Medications - No data to display  Initial Impression / Assessment and Plan / UC Course  I have reviewed the triage vital signs and the nursing notes.  Pertinent labs & imaging results that were available during my care of the patient were reviewed by me and considered in my medical decision making (see chart for details).     MDM: 1.  Skin tag removal-Rx'd Doxycycline. Advised patient to keep affected/previous skin tag area covered for the next 24 to 36 hours then uncovered to heal by secondary intention.  Instructed patient to take medication as directed with food to completion.  Advised patient to increase daily water intake while taking this medication.  If symptoms worsen and or unresolved please follow-up with PCP or here for further evaluation. Final Clinical Impressions(s) / UC Diagnoses   Final diagnoses:  Skin tag     Discharge Instructions      Advised patient to keep affected/previous skin tag area covered for the next 24 to 36 hours then uncovered to heal by secondary intention.  Instructed patient to take medication as directed with food to completion.  Advised patient to increase daily water intake while taking this medication.  If symptoms worsen and or unresolved please follow-up with PCP or here for further evaluation.     ED Prescriptions     Medication Sig Dispense Auth. Provider   doxycycline (VIBRAMYCIN)  100 MG capsule Take 1 capsule (100 mg total) by mouth 2 (two) times daily for 7 days. 14 capsule Trevor Ihaagan, Mykala Mccready, FNP      PDMP not reviewed this encounter.  Trevor Iha, FNP 09/03/21 1810

## 2021-09-03 NOTE — Discharge Instructions (Addendum)
Advised patient to keep affected/previous skin tag area covered for the next 24 to 36 hours then uncovered to heal by secondary intention.  Instructed patient to take medication as directed with food to completion.  Advised patient to increase daily water intake while taking this medication.  If symptoms worsen and or unresolved please follow-up with PCP or here for further evaluation.

## 2021-09-03 NOTE — ED Triage Notes (Signed)
Pt presents to Urgent Care with c/o skin tag to L axillary area which he tied dental floss around 3 days ago in attempt to remove it. States area is sore to touch and there is a "blistered area" on the tip of the skin tag now.

## 2023-01-10 ENCOUNTER — Ambulatory Visit: Payer: BC Managed Care – PPO

## 2023-01-10 ENCOUNTER — Ambulatory Visit
Admission: RE | Admit: 2023-01-10 | Discharge: 2023-01-10 | Disposition: A | Discharge status: Home / Self Care | Payer: BC Managed Care – PPO | Source: Ambulatory Visit | Attending: Family Medicine | Admitting: Family Medicine

## 2023-01-10 VITALS — BP 171/106 | HR 75 | Temp 99.3°F | Resp 18

## 2023-01-10 DIAGNOSIS — R109 Unspecified abdominal pain: Secondary | ICD-10-CM

## 2023-01-10 DIAGNOSIS — R1032 Left lower quadrant pain: Secondary | ICD-10-CM

## 2023-01-10 DIAGNOSIS — K5900 Constipation, unspecified: Secondary | ICD-10-CM | POA: Diagnosis not present

## 2023-01-10 HISTORY — DX: Unspecified hemorrhoids: K64.9

## 2023-01-10 HISTORY — DX: Calculus of kidney: N20.0

## 2023-01-10 MED ORDER — MAGNESIUM CITRATE PO SOLN: ORAL | 0 refills | Status: AC

## 2023-01-10 NOTE — Discharge Instructions (Addendum)
Advised patient to take medication to completion.  Encourage increase daily water intake to 64 ounces per day 7 days/week.  Encouraged patient to increase daily fiber intake to 60 to 80 g/day 7 days/week.  Advised we will follow-up with x-ray results once received.  Advised if symptoms worsen and/or unresolved please follow-up PCP or here for further evaluation.

## 2023-01-10 NOTE — ED Provider Notes (Signed)
[name]    CSN: [id number] Arrival date & time: 01/10/23  1650      History   Chief Complaint Chief Complaint  Patient presents with   Abdominal Pain    Severe abdominal and back pain at least a week and a half. Originally think I may have passed a kidney stone. Now it feels more like an obstruction or blockage. Perhaps? It's excruciating and I can't stand it anymore! - Entered by patient   Flank Pain    HPI Chad Bell is a 47 y.o. male.   HPI 47 year old male presents with severe abdominal pain since Tuesday of last week.  Patient currently reports left lower quadrant burning pain and has used leftover pain medication previously prescribed for kidney stone which may have caused constipation.  Patient reports nausea without vomiting.  Past Medical History:  Diagnosis Date   GERD (gastroesophageal reflux disease)    Hemorrhoid    Hiatal hernia    Kidney stone     There are no problems to display for this patient.   Past Surgical History:  Procedure Laterality Date   TESTICLE TORSION REDUCTION         Home Medications    Prior to Admission medications   Medication Sig Start Date End Date Taking? Authorizing Provider  esomeprazole (NEXIUM) 40 MG capsule Take by mouth. 06/22/19  Yes [provider]  magnesium citrate SOLN Take 150 mL PO twice daily for 3 days 01/10/23  Yes Trevor Iha, FNP  fexofenadine (ALLEGRA) 180 MG tablet Take 180 mg by mouth daily. 08/06/21   [provider]  lisinopril (PRINIVIL,ZESTRIL) 5 MG tablet Take 1 tablet (5 mg total) by mouth daily. 09/01/16 09/01/17  Rockne Menghini, MD    Family History Family History  Problem Relation Age of Onset   Fibromyalgia Mother    Cancer Father     Social History Social History   Tobacco Use   Smoking status: Every Day    Current packs/day: 1.00    Types: Cigarettes   Smokeless tobacco: Never  Vaping Use   Vaping status: Never Used  Substance Use  Topics   Alcohol use: Yes    Alcohol/week: 6.0 standard drinks of alcohol    Types: 6 Shots of liquor per week   Drug use: Yes    Types: Marijuana     Allergies   Patient has no known allergies.   Review of Systems Review of Systems  Gastrointestinal:  Positive for abdominal pain.     Physical Exam Triage Vital Signs ED Triage Vitals  Encounter Vitals Group     BP      Systolic BP Percentile      Diastolic BP Percentile      Pulse      Resp      Temp      Temp src      SpO2      Weight      Height      Head Circumference      Peak Flow      Pain Score      Pain Loc      Pain Education      Exclude from Growth Chart    No data found.  Updated Vital Signs BP (!) 171/106 (BP Location: Right Arm)   Pulse 75   Temp 99.3 F (37.4 C) (Oral)   Resp 18   SpO2 98%    Physical Exam Vitals and nursing note reviewed.  Constitutional:      Appearance: He is well-developed. He is obese. He is not ill-appearing.  HENT:     Head: Normocephalic and atraumatic.     Mouth/Throat:     Mouth: Mucous membranes are moist.     Pharynx: Oropharynx is clear.  Eyes:     Extraocular Movements: Extraocular movements intact.     Pupils: Pupils are equal, round, and reactive to light.  Cardiovascular:     Rate and Rhythm: Normal rate and regular rhythm.     Heart sounds: Normal heart sounds.  Pulmonary:     Effort: Pulmonary effort is normal.     Breath sounds: Normal breath sounds. No wheezing, rhonchi or rales.  Abdominal:     General: Abdomen is flat. Bowel sounds are absent.     Palpations: Abdomen is soft. There is no shifting dullness, fluid wave, hepatomegaly, mass or pulsatile mass.     Tenderness: There is abdominal tenderness in the left lower quadrant. There is no right CVA tenderness, left CVA tenderness, guarding or rebound. Negative signs include Murphy's sign, Rovsing's sign, McBurney's sign and psoas sign.     Hernia: No hernia is present.  Skin:    General:  Skin is warm and dry.  Neurological:     General: No focal deficit present.     Mental Status: He is alert and oriented to person, place, and time.  Psychiatric:        Mood and Affect: Mood normal.        Behavior: Behavior normal.      UC Treatments / Results  Labs (all labs ordered are listed, but only abnormal results are displayed) Labs Reviewed - No data to display  EKG   Radiology No results found.  Procedures Procedures (including critical care time)  Medications Ordered in UC Medications - No data to display  Initial Impression / Assessment and Plan / UC Course  I have reviewed the triage vital signs and the nursing notes.  Pertinent labs & imaging results that were available during my care of the patient were reviewed by me and considered in my medical decision making (see chart for details).     MDM: 1.  Abdominal pain left lower quadrant-advised patient if pain worsens and/or unresolved please go to nearest ED for immediate evaluation; 2.  Constipation, unspecified type constipation-initial appearance on KUB appears to be large stool burden, advised patient we will follow-up with x-ray results once received.  Rx'd mag citrate solution: Take 150 mL p.o. twice daily x 3 days. Advised patient to take medication to completion.  Encourage increase daily water intake to 64 ounces per day 7 days/week.  Encouraged patient to increase daily fiber intake to 60 to 80 g/day 7 days/week.  Advised we will follow-up with x-ray results once received.  Advised if symptoms worsen and/or unresolved please follow-up PCP or here for further evaluation.  Patient discharged home, hemodynamically stable. Final Clinical Impressions(s) / UC Diagnoses   Final diagnoses:  Abdominal pain, left lower quadrant  Constipation, unspecified constipation type     Discharge Instructions      Advised patient to take medication to completion.  Encourage increase daily water intake to 64 ounces  per day 7 days/week.  Encouraged patient to increase daily fiber intake to 60 to 80 g/day 7 days/week.  Advised we will follow-up with x-ray results once received.  Advised if symptoms worsen and/or unresolved please follow-up PCP or here for further evaluation.  ED Prescriptions     Medication Sig Dispense Auth. Provider   magnesium citrate SOLN Take 150 mL PO twice daily for 3 days 900 mL Trevor Iha, FNP      PDMP not reviewed this encounter.   Trevor Iha, FNP 01/10/23 1956

## 2023-01-10 NOTE — ED Triage Notes (Signed)
Pt reports L flank pain that radiates to LLQ (burning pain) that began last Tuesday. Pain has been intermittent. Initially was using leftover pain meds, but then became concerned for constipation. Pt has had small BM today. Endorses nausea without vomiting. No urinary symptoms. Has increased fluid intake, as well as using ibuprofen and heating pad.
